# Patient Record
Sex: Female | Born: 1970 | Race: Asian | Hispanic: No | Marital: Married | Smoking: Never smoker
Health system: Southern US, Community
[De-identification: ages and names within clinical notes are randomized; demographics above are authoritative.]

## PROBLEM LIST (undated history)

## (undated) DIAGNOSIS — J45909 Unspecified asthma, uncomplicated: Secondary | ICD-10-CM

## (undated) DIAGNOSIS — M810 Age-related osteoporosis without current pathological fracture: Secondary | ICD-10-CM

## (undated) HISTORY — DX: Unspecified asthma, uncomplicated: J45.909

## (undated) HISTORY — PX: BREAST ENHANCEMENT SURGERY: SHX7

## (undated) HISTORY — PX: AUGMENTATION MAMMAPLASTY: SUR837

## (undated) HISTORY — DX: Age-related osteoporosis without current pathological fracture: M81.0

---

## 1998-03-27 ENCOUNTER — Inpatient Hospital Stay (HOSPITAL_COMMUNITY): Admission: AD | Admit: 1998-03-27 | Discharge: 1998-03-29 | Payer: Self-pay | Admitting: Obstetrics and Gynecology

## 1999-01-29 ENCOUNTER — Encounter: Admission: RE | Admit: 1999-01-29 | Discharge: 1999-04-29 | Payer: Self-pay | Admitting: Internal Medicine

## 1999-08-24 ENCOUNTER — Other Ambulatory Visit: Admission: RE | Admit: 1999-08-24 | Discharge: 1999-08-24 | Payer: Self-pay | Admitting: Obstetrics and Gynecology

## 2000-02-07 ENCOUNTER — Inpatient Hospital Stay (HOSPITAL_COMMUNITY): Admission: AD | Admit: 2000-02-07 | Discharge: 2000-02-10 | Payer: Self-pay | Admitting: Obstetrics and Gynecology

## 2000-02-08 ENCOUNTER — Encounter: Payer: Self-pay | Admitting: Obstetrics and Gynecology

## 2000-05-18 ENCOUNTER — Inpatient Hospital Stay (HOSPITAL_COMMUNITY): Admission: AD | Admit: 2000-05-18 | Discharge: 2000-05-18 | Payer: Self-pay | Admitting: Obstetrics and Gynecology

## 2000-06-07 ENCOUNTER — Inpatient Hospital Stay (HOSPITAL_COMMUNITY): Admission: AD | Admit: 2000-06-07 | Discharge: 2000-06-09 | Payer: Self-pay | Admitting: *Deleted

## 2000-07-11 ENCOUNTER — Other Ambulatory Visit: Admission: RE | Admit: 2000-07-11 | Discharge: 2000-07-11 | Payer: Self-pay | Admitting: Obstetrics and Gynecology

## 2000-10-28 ENCOUNTER — Encounter: Payer: Self-pay | Admitting: Internal Medicine

## 2000-10-28 ENCOUNTER — Encounter: Admission: RE | Admit: 2000-10-28 | Discharge: 2000-10-28 | Payer: Self-pay | Admitting: Internal Medicine

## 2000-11-15 ENCOUNTER — Encounter: Admission: RE | Admit: 2000-11-15 | Discharge: 2000-11-15 | Payer: Self-pay | Admitting: Internal Medicine

## 2000-11-15 ENCOUNTER — Encounter: Payer: Self-pay | Admitting: Internal Medicine

## 2001-08-24 ENCOUNTER — Other Ambulatory Visit: Admission: RE | Admit: 2001-08-24 | Discharge: 2001-08-24 | Payer: Self-pay | Admitting: Internal Medicine

## 2002-08-23 ENCOUNTER — Other Ambulatory Visit: Admission: RE | Admit: 2002-08-23 | Discharge: 2002-08-23 | Payer: Self-pay | Admitting: Internal Medicine

## 2003-04-15 ENCOUNTER — Other Ambulatory Visit: Admission: RE | Admit: 2003-04-15 | Discharge: 2003-04-15 | Payer: Self-pay | Admitting: Obstetrics and Gynecology

## 2003-10-28 ENCOUNTER — Other Ambulatory Visit: Admission: RE | Admit: 2003-10-28 | Discharge: 2003-10-28 | Payer: Self-pay | Admitting: Obstetrics and Gynecology

## 2004-04-28 ENCOUNTER — Other Ambulatory Visit: Admission: RE | Admit: 2004-04-28 | Discharge: 2004-04-28 | Payer: Self-pay | Admitting: Obstetrics and Gynecology

## 2004-10-27 ENCOUNTER — Other Ambulatory Visit: Admission: RE | Admit: 2004-10-27 | Discharge: 2004-10-27 | Payer: Self-pay | Admitting: Obstetrics and Gynecology

## 2005-04-27 ENCOUNTER — Other Ambulatory Visit: Admission: RE | Admit: 2005-04-27 | Discharge: 2005-04-27 | Payer: Self-pay | Admitting: Obstetrics and Gynecology

## 2006-04-04 ENCOUNTER — Encounter: Admission: RE | Admit: 2006-04-04 | Discharge: 2006-04-04 | Payer: Self-pay | Admitting: Obstetrics and Gynecology

## 2007-07-27 ENCOUNTER — Emergency Department (HOSPITAL_COMMUNITY): Admission: EM | Admit: 2007-07-27 | Discharge: 2007-07-27 | Payer: Self-pay | Admitting: Family Medicine

## 2010-06-09 ENCOUNTER — Other Ambulatory Visit: Payer: Self-pay | Admitting: Obstetrics and Gynecology

## 2010-06-09 DIAGNOSIS — Z1239 Encounter for other screening for malignant neoplasm of breast: Secondary | ICD-10-CM

## 2010-07-01 ENCOUNTER — Ambulatory Visit
Admission: RE | Admit: 2010-07-01 | Discharge: 2010-07-01 | Disposition: A | Discharge status: Home / Self Care | Payer: 59 | Source: Ambulatory Visit | Attending: Obstetrics and Gynecology | Admitting: Obstetrics and Gynecology

## 2010-07-01 DIAGNOSIS — Z1239 Encounter for other screening for malignant neoplasm of breast: Secondary | ICD-10-CM

## 2010-10-02 NOTE — Discharge Summary (Signed)
[facility]  Patient:    Anna Moreno, Anna Moreno                        MRN: [id number] Adm. Date:  [id number] Disc. Date: [id number] Attending:  Madelyn Flavors Dictator:   Danie Chandler, R.N.                           Discharge Summary  ADMISSION DIAGNOSES:          1. Intrauterine pregnancy at [redacted] weeks                                  gestation with leukorrhea and dysuria.                               2. Possible pyelonephritis.  DISCHARGE DIAGNOSIS:          Intrauterine pregnancy at 21 weeks with possible viral syndrome.  PROCEDURES:                   IV antibiotics, renal ultrasound, blood cultures and urine culture.  REASON FOR ADMISSION:         The patient is a 41 year old married [place] female gravida 3, para 1, who called initially complaining of headache and a temperature of 103.  She was directed to the [facility] by Beather Arbour. Thomasena Edis, M.D., to rule out meningitis.  The patient saw Doreatha Lew, M.D., there with a negative meningitis work-up as well as a negative throat rapid Strep culture.  The patient was then seen at the [facility] where she was denying any upper respiratory tract infection signs or symptoms.  She was denying any abdominal pain, back pain or flank pain.  The patient was complaining of some slight dysuria at that time.  The patient was having no vaginal bleeding, leaking, or uterine contractions.  On admission, the patients temperature at that time was 100.6.  Her lungs were clear.  Her abdomen was soft and nontender with a gravid uterus.  The patient had no CVA tenderness and no calf tenderness.  Her labs did show a urinalysis of 10 to 20 white blood cells per high powered field with moderate bacteria.  HOSPITAL COURSE:              The patient was admitted and did receive IV antibiotics in the form of ampicillin and gentamicin.  She had a urine culture obtained and blood cultures obtained.  On  hospitalization day #1, the patient denied any pain. Temperature had been 101.1 earlier in the morning but on the morning of February 08, 2000, was 97.6.  The patient continued to have no CVA tenderness.  Her renal ultrasound was normal.  White blood cell count was 7.0, hemoglobin 9.0 with a normal differential.  On hospitalization day #1, her blood cultures and urine culture were pending. She was continued on IV antibiotics.  On hospitalization day #2, the patient still felt well. Temperature was 100.9, abdomen was gravid and nontender.  The patient did have positive fetal heart tones and she was continued on antibiotics.  She was discharged home on hospitalization day #3.  She had been afebrile x 24 hours. The patient had a mild headache but no other complaints.  There was no CVA or  uterine tenderness.  All cultures were negative and the patient was discharged home improved with a probable viral syndrome.  CONDITION ON DISCHARGE:       Improved.  DIET:                         Regular as tolerated.  FOLLOW-UP:                    She is to follow up in the office as directed by M.D. in approximately two weeks.  DISCHARGE INSTRUCTIONS:       She is to call for any urinary tract infection signs or symptoms, any temperature, uterine contractions, bleeding or leaking of fluid.  DISCHARGE MEDICATIONS:        Prenatal vitamin one p.o. q.d. DD:  04/12/00 TD:  04/12/00 Job: [id number]

## 2011-02-08 LAB — INFLUENZA A AND B ANTIGEN (CONVERTED LAB)
Inflenza A Ag: NEGATIVE
Influenza B Ag: NEGATIVE

## 2011-12-01 ENCOUNTER — Other Ambulatory Visit: Payer: Self-pay | Admitting: Obstetrics and Gynecology

## 2011-12-01 DIAGNOSIS — Z1231 Encounter for screening mammogram for malignant neoplasm of breast: Secondary | ICD-10-CM

## 2012-01-12 ENCOUNTER — Ambulatory Visit
Admission: RE | Admit: 2012-01-12 | Discharge: 2012-01-12 | Disposition: A | Discharge status: Home / Self Care | Payer: BC Managed Care – PPO | Source: Ambulatory Visit | Attending: Obstetrics and Gynecology | Admitting: Obstetrics and Gynecology

## 2012-01-12 DIAGNOSIS — Z1231 Encounter for screening mammogram for malignant neoplasm of breast: Secondary | ICD-10-CM

## 2012-08-16 ENCOUNTER — Other Ambulatory Visit: Payer: Self-pay | Admitting: Plastic Surgery

## 2012-08-16 ENCOUNTER — Ambulatory Visit
Admission: RE | Admit: 2012-08-16 | Discharge: 2012-08-16 | Disposition: A | Discharge status: Home / Self Care | Payer: BC Managed Care – PPO | Source: Ambulatory Visit | Attending: Plastic Surgery | Admitting: Plastic Surgery

## 2012-08-16 DIAGNOSIS — N63 Unspecified lump in unspecified breast: Secondary | ICD-10-CM

## 2013-01-05 ENCOUNTER — Encounter (INDEPENDENT_AMBULATORY_CARE_PROVIDER_SITE_OTHER): Payer: Self-pay

## 2013-01-05 ENCOUNTER — Encounter (INDEPENDENT_AMBULATORY_CARE_PROVIDER_SITE_OTHER): Payer: Self-pay | Admitting: Surgery

## 2013-01-08 ENCOUNTER — Encounter (INDEPENDENT_AMBULATORY_CARE_PROVIDER_SITE_OTHER): Payer: Self-pay | Admitting: Surgery

## 2013-01-08 ENCOUNTER — Ambulatory Visit (INDEPENDENT_AMBULATORY_CARE_PROVIDER_SITE_OTHER): Payer: BC Managed Care – PPO | Admitting: Surgery

## 2013-01-08 VITALS — BP 102/60 | HR 72 | Resp 16 | Ht 64.5 in | Wt 101.6 lb

## 2013-01-08 DIAGNOSIS — E210 Primary hyperparathyroidism: Secondary | ICD-10-CM

## 2013-01-08 NOTE — Progress Notes (Signed)
General Surgery [facility]  Chief Complaint  Patient presents with  . New Evaluation    eval hyperparathyroidism - referral from Dr. Dorisann Frames and Dr. Candice Camp    HISTORY: Patient is a 42 year old female referred by her gynecologist and her endocrinologist for evaluation of possible primary hyperparathyroidism. Patient has been monitored closely do to a history of osteoporosis and her mother. Bone density scanning has demonstrated osteopenia. Recent laboratory testing showed a normal serum calcium level of 9.6. However her intact parathyroid hormone level was markedly elevated at 152.2. 25 hydroxy vitamin D level was low at 23 and the patient is now on therapy.  History of osteopenia. Distant history of nephrolithiasis. Moderate fatigue. Mild memory loss.  Patient has had no prior history of surgery on the neck. Patient's mother had thyroid problems.  Past Medical History  Diagnosis Date  . Osteoporosis   . Asthma     Current Outpatient Prescriptions  Medication Sig Dispense Refill  . alendronate (FOSAMAX) 70 MG tablet       . ibandronate (BONIVA) 150 MG tablet Take 150 mg by mouth every 30 (thirty) days. Take in the morning with a full glass of water, on an empty stomach, and do not take anything else by mouth or lie down for the next 30 min.      . Vitamin D, Ergocalciferol, (DRISDOL) 50000 UNITS CAPS capsule Take 50,000 Units by mouth.       No current facility-administered medications for this visit.    No Known Allergies  Family History  Problem Relation Age of Onset  . Cancer Maternal Grandmother     ovarian  . Cancer Maternal Grandfather     prostate    History   Social History  . Marital Status: Married    Spouse Name: N/A    Number of Children: N/A  . Years of Education: N/A   Social History Main Topics  . Smoking status: Never Smoker   . Smokeless tobacco: Never Used  . Alcohol Use: No  . Drug Use: No  . Sexual Activity: None    Other Topics Concern  . None   Social History Narrative  . None    REVIEW OF SYSTEMS - PERTINENT POSITIVES ONLY: Chronic fatigue. Distant history of nephrolithiasis. Osteopenia.  EXAM: Filed Vitals:   01/08/13 0945  BP: 102/60  Pulse: 72  Resp: 16    HEENT: normocephalic; pupils equal and reactive; sclerae clear; dentition good; mucous membranes moist NECK:  No palpable masses in the thyroid bed; symmetric on extension; no palpable anterior or posterior cervical lymphadenopathy; no supraclavicular masses; no tenderness CHEST: clear to auscultation bilaterally without rales, rhonchi, or wheezes CARDIAC: regular rate and rhythm without significant murmur; peripheral pulses are full EXT:  non-tender without edema; no deformity NEURO: no gross focal deficits; no sign of tremor   LABORATORY RESULTS: See [facility] (CHL-Epic) for most recent results  RADIOLOGY RESULTS: See [facility] (CHL-Epic) for most recent results  IMPRESSION: #1 Probable primary hyperparathyroidism #2 personal history of osteopenia #3 vitamin D deficiency  PLAN: The patient and I reviewed the above studies at length. I provided her with written literature on parathyroid disease to review. I would like to obtain a nuclear medicine parathyroid scan and a 24-hour urine collection for calcium. Patient is currently being treated with ergocalciferol for vitamin D deficiency. Her endocrinologist plans to repeat her laboratory studies following 8 weeks of treatment.  We will obtain the above studies. I will  contact her with the results. We will then likely wait until she is completed 8 weeks of treatment with ergocalciferol and her laboratories will be repeated by her endocrinologist at that time. We will then review the results and plan for further evaluation and management.  Velora Heckler, MD, FACS General & Endocrine Surgery [facility]  Primary Care Physician: Turner Daniels,  MD

## 2013-01-08 NOTE — Addendum Note (Signed)
Addended by: Joanette Gula on: 01/08/2013 10:20 AM   Modules accepted: Orders

## 2013-01-08 NOTE — Patient Instructions (Signed)
Parathyroidectomy A parathyroidectomy is surgery to remove one or more parathyroid glands. These glands produce a hormone (parathyroid hormone) that helps control the level of calcium in your body. The glands are very small, about the size of a pea. They are located in your neck, close to your thyroid gland and your Adam's apple. Most people (85%) have four parathyroid glands,some people may have one or two more than that. Hyperparathyroidism is when too much parathyroid hormone is being produced. Usually this is caused by one of the parathyroid glands becoming enlarged, but it can also be caused by more than one of the glands. Hyperparathyroidism is found during blood tests that show high calcium in the blood. Parathyroid hormone levels will also be elevated. Cancer also can cause hyperparathyroidism, but this is rare. For the most common type of hyperparathyroidism, the treatment is surgical removal of the parathyroid gland that is enlarged. For patients with kidney failure and hyperparathyroidism, other treatment will be tried before surgery is done on the parathyroid.  Many times x-ray studies are done to find out which parathyroid gland or glands is malfunctioning. The decision about the best treatment for hyperparathyroidism is between the patient, their primary doctor, an endocrinologist, and a surgeon experienced in parathyroid surgery. LET YOUR CAREGIVER KNOW ABOUT:  Any allergies.  All medications you are taking, including:  Herbs, eyedrops, over-the-counter medications and creams.  Blood thinners (anticoagulants), aspirin or other drugs that could affect blood clotting.  Use of steroids (by mouth or as creams).  Previous problems with anesthetics, including local anesthetics.  Possibility of pregnancy, if this applies.  Any history of blood clots.  Any history of bleeding or other blood problems.  Previous surgery.  Smoking history.  Other health problems. RISKS AND  COMPLICATIONS   Short-term possibilities include:  Excessive bleeding.  Pain.  Infection near the incision.  Slow healing.  Pooling of blood under the wound (hematoma).  Damage to nerves in your neck.  Blood clots.  Difficulty breathing. This is very rare. It also is almost always temporary.  Longer-term possibilities include:  Scarring.  Skin damage.  Damage to blood vessels in the area.  Need for additional surgery.  A hoarse or weak voice. This is usually temporary. It can be the result of nerve damage.  Development of hypoparathyroidism. This means you are not making enough parathyroid hormone. It is rare. If it occurs, you will need to take calcium supplements daily. BEFORE THE PROCEDURE  Sometimes the surgery is done on an outpatient basis. This means you could go home the same day as your surgery. Other times, people need to stay in the hospital overnight. Ask your surgeon what you should expect.  If your surgery will be an outpatient procedure, arrange for someone to drive you home after the surgery.  Two weeks before your surgery, stop using aspirin and non-steroidal anti-inflammatory drugs (NSAID's) for pain relief. This includes prescription drugs and over-the-counter drugs such as ibuprofen and naproxen. Also stop taking vitamin E.  If you take blood-thinners, ask your healthcare provider when you should stop taking them.  Do not eat or drink for about 8 hours before your surgery.  You might be asked to shower or wash with a special antibacterial soap before the procedure.  Arrive at least an hour before the surgery, or whenever your surgeon recommends. This will give you time to check in and fill out any needed paperwork. PROCEDURE  The preparation:  You will change into a hospital gown.  You   will be given an IV. A needle will be inserted in your arm. Medication will be able to flow directly into your body through this needle.  You might be given a  sedative to help you relax.  You will be given a drug that puts you to sleep during the surgery (general anesthetic).  The procedure:  Once you are asleep, the surgeon will make a small cut (incision) in your lower neck. Ask your surgeon where the incision will be.  The surgeon will look for the gland(s) that are not working well. Often a tissue sample from a gland is used to determine this.  Any glands that are not working well will be removed.  The surgeon will close the incision with stitches, often these are hidden under the skin. AFTER THE PROCEDURE  You will stay in a recovery area until the anesthesia has worn off. Your blood pressure and heart rate will be checked.  If your surgery was an outpatient procedure, you will go home the same day.  If you need to stay in the hospital, you will be moved to a hospital room. You will probably stay for two to three days. This will depend on how quickly you recover.  While you are in the hospital, your blood will be tested to check the calcium levels in your body. HOME CARE INSTRUCTIONS   Take any medication that your surgeon prescribes. Follow the directions carefully. Take all of the medication.  Ask your surgeon whether you can take over-the-counter medicines for pain, discomfort or fever. Do not take aspirin without permission from the surgeon. Aspirin increases the chances of bleeding.  Do not get the wound wet for the first few days after surgery (or until the surgeon tells you it is OK).  After this procedure, many patients may develop low calcium levels in the blood. It is critical that you see your medical caregiver to have this monitored and managed.     SEEK MEDICAL CARE IF:   You notice blood or fluid leaking from the wound, or it becomes red or swollen.  You have trouble breathing.  You have trouble speaking.  You become nauseous or throw up for more than two days after the surgery.  You have a fever or  persistent symptoms for more than 2-3 days. SEEK IMMEDIATE MEDICAL CARE IF:   Breathing becomes more difficult.  You have a fever and your symptoms suddenly get worse. Document Released: 07/30/2008 Document Revised: 04/19/2012 Document Reviewed: 07/30/2008 Exit[employer] Patient Information 2014 [employer].  

## 2013-01-08 NOTE — Addendum Note (Signed)
Addended by: Joanette Gula on: 01/08/2013 10:17 AM   Modules accepted: Orders

## 2013-01-17 ENCOUNTER — Encounter (HOSPITAL_COMMUNITY)
Admission: RE | Admit: 2013-01-17 | Discharge: 2013-01-17 | Disposition: A | Discharge status: Home / Self Care | Payer: BC Managed Care – PPO | Source: Ambulatory Visit | Attending: Surgery | Admitting: Surgery

## 2013-01-17 DIAGNOSIS — E210 Primary hyperparathyroidism: Secondary | ICD-10-CM | POA: Insufficient documentation

## 2013-01-17 MED ORDER — TECHNETIUM TC 99M SESTAMIBI - CARDIOLITE
25.0000 | Freq: Once | INTRAVENOUS | Status: AC | PRN
  Administered 2013-01-17: 25 via INTRAVENOUS

## 2013-01-25 ENCOUNTER — Telehealth (INDEPENDENT_AMBULATORY_CARE_PROVIDER_SITE_OTHER): Payer: Self-pay

## 2013-01-25 NOTE — Telephone Encounter (Signed)
Scan result in epic. Note to Dr Gerrit Friends to review and advise.

## 2013-02-02 ENCOUNTER — Encounter (INDEPENDENT_AMBULATORY_CARE_PROVIDER_SITE_OTHER): Payer: Self-pay

## 2013-02-12 NOTE — Telephone Encounter (Signed)
Telephoned patient and LMOM.  Nuclear med scan is negative for parathyroid adenoma.  24 hour urine collection for calcium is normal.  I would like patient to continue on prescription Vit D supplement.  She is scheduled to have repeat lab work done by her gynecologist next month.  I would like to review those results.  Will advise further once Vit D level is normalized.  Velora Heckler, MD, [facility] Office: [phone]

## 2014-08-30 IMAGING — NM NM PARATHYROID W/ SPECT
4 series · 19 of 19 positions shown · non-contrast
Comparison: None.

CLINICAL DATA: Hyperparathyroidism.

EXAM:
NM PARATHYROID SCINTIGRAPHY AND SPECT IMAGING
TECHNIQUE: Following intravenous administration of radiopharmaceutical, early
and 2-hour delayed planar images were obtained in the anterior
projection. Delayed triplanar SPECT images were also obtained at 2
hours.

[Series 1: pt parathyroid · 2.37mm/px · 1 of 1 slices shown (1 of 4)]
[im 1/1]
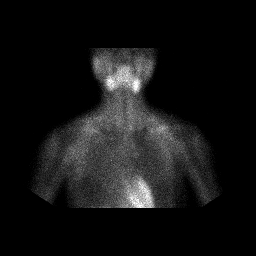

[Series 1: pt parathyroid · 4.7mm · 4.75mm/px · 6 of 91 frames shown (2 of 4)]
[frame 8/91]
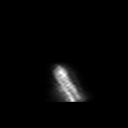
[frame 23/91]
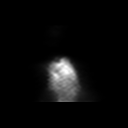
[frame 38/91]
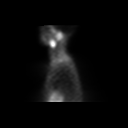
[frame 53/91]
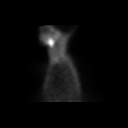
[frame 68/91]
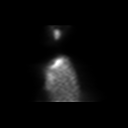
[frame 84/91]
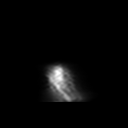

[Series 1: pt parathyroid · 4.7mm · 4.75mm/px · 6 of 91 frames shown (3 of 4)]
[frame 8/91]
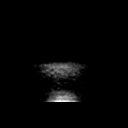
[frame 23/91]
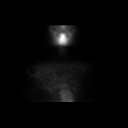
[frame 38/91]
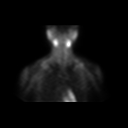
[frame 53/91]
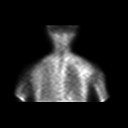
[frame 68/91]
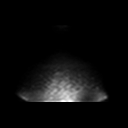
[frame 84/91]
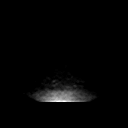

[Series 1: pt parathyroid · 4.7mm · 4.75mm/px · 6 of 91 frames shown (4 of 4)]
[frame 8/91]
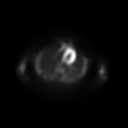
[frame 23/91]
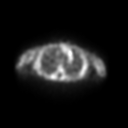
[frame 38/91]
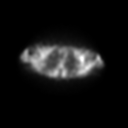
[frame 53/91]
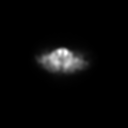
[frame 68/91]
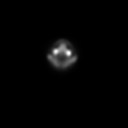
[frame 84/91]
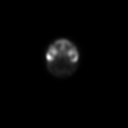

[19 of 19 positions shown; findings below may reference images not displayed]

RADIOPHARMACEUTICALS:  [id number] GARGI CARDIOLITE TECHNETIUM TC 99M
SESTAMIBI - [id number] Sestamibi IV
FINDINGS: Initial uptake bouts salivary glands and thyroid gland. There is
normal washout. No persistent activity visualized to suggest a
parathyroid adenoma.
IMPRESSION: No persistent activity to localize a parathyroid adenoma.

## 2015-01-23 ENCOUNTER — Other Ambulatory Visit: Payer: Self-pay

## 2015-01-23 DIAGNOSIS — Z1231 Encounter for screening mammogram for malignant neoplasm of breast: Secondary | ICD-10-CM

## 2015-01-29 ENCOUNTER — Ambulatory Visit
Admission: RE | Admit: 2015-01-29 | Discharge: 2015-01-29 | Disposition: A | Discharge status: Home / Self Care | Payer: PRIVATE HEALTH INSURANCE | Source: Ambulatory Visit

## 2015-01-29 DIAGNOSIS — Z1231 Encounter for screening mammogram for malignant neoplasm of breast: Secondary | ICD-10-CM

## 2015-12-22 ENCOUNTER — Other Ambulatory Visit: Payer: Self-pay | Admitting: Obstetrics and Gynecology

## 2015-12-22 DIAGNOSIS — Z1231 Encounter for screening mammogram for malignant neoplasm of breast: Secondary | ICD-10-CM

## 2015-12-22 DIAGNOSIS — Z9882 Breast implant status: Secondary | ICD-10-CM

## 2016-01-12 ENCOUNTER — Encounter: Payer: Self-pay | Admitting: Genetic Counselor

## 2016-01-12 ENCOUNTER — Telehealth: Payer: Self-pay | Admitting: Genetic Counselor

## 2016-01-12 NOTE — Telephone Encounter (Signed)
Pt confirmed appt, completed intake, updated insurance, pt will bring in [facility] card, mailed new pt letter, faxed referring provider appt date/time

## 2016-01-30 ENCOUNTER — Ambulatory Visit
Admission: RE | Admit: 2016-01-30 | Discharge: 2016-01-30 | Disposition: A | Discharge status: Home / Self Care | Payer: 59 | Source: Ambulatory Visit | Attending: Obstetrics and Gynecology | Admitting: Obstetrics and Gynecology

## 2016-01-30 DIAGNOSIS — Z9882 Breast implant status: Secondary | ICD-10-CM

## 2016-01-30 DIAGNOSIS — Z1231 Encounter for screening mammogram for malignant neoplasm of breast: Secondary | ICD-10-CM

## 2016-02-17 ENCOUNTER — Telehealth: Payer: Self-pay | Admitting: Genetic Counselor

## 2016-02-17 NOTE — Telephone Encounter (Signed)
Patient called to cancel appt. Will call back to [facility]schd. 02/17/16

## 2016-02-19 ENCOUNTER — Encounter: Payer: PRIVATE HEALTH INSURANCE | Admitting: Genetic Counselor

## 2016-02-19 ENCOUNTER — Other Ambulatory Visit: Payer: PRIVATE HEALTH INSURANCE

## 2016-12-20 ENCOUNTER — Other Ambulatory Visit: Payer: Self-pay | Admitting: Obstetrics and Gynecology

## 2016-12-20 DIAGNOSIS — Z1231 Encounter for screening mammogram for malignant neoplasm of breast: Secondary | ICD-10-CM

## 2017-01-31 ENCOUNTER — Ambulatory Visit
Admission: RE | Admit: 2017-01-31 | Discharge: 2017-01-31 | Disposition: A | Discharge status: Home / Self Care | Payer: 59 | Source: Ambulatory Visit | Attending: Obstetrics and Gynecology | Admitting: Obstetrics and Gynecology

## 2017-01-31 DIAGNOSIS — Z1231 Encounter for screening mammogram for malignant neoplasm of breast: Secondary | ICD-10-CM
